# Patient Record
Sex: Male | Born: 1970 | Race: White | Hispanic: No | Marital: Single | Smoking: Never smoker
Health system: Southern US, Community
[De-identification: ages and names within clinical notes are randomized; demographics above are authoritative.]

## PROBLEM LIST (undated history)

## (undated) DIAGNOSIS — J45909 Unspecified asthma, uncomplicated: Secondary | ICD-10-CM

## (undated) DIAGNOSIS — I10 Essential (primary) hypertension: Secondary | ICD-10-CM

---

## 1999-09-11 ENCOUNTER — Emergency Department (HOSPITAL_COMMUNITY): Admission: EM | Admit: 1999-09-11 | Discharge: 1999-09-11 | Payer: Self-pay | Admitting: *Deleted

## 2000-01-20 ENCOUNTER — Encounter: Payer: Self-pay | Admitting: Emergency Medicine

## 2000-01-20 ENCOUNTER — Emergency Department (HOSPITAL_COMMUNITY): Admission: EM | Admit: 2000-01-20 | Discharge: 2000-01-20 | Payer: Self-pay | Admitting: Emergency Medicine

## 2004-08-09 ENCOUNTER — Emergency Department (HOSPITAL_COMMUNITY): Admission: EM | Admit: 2004-08-09 | Discharge: 2004-08-09 | Payer: Self-pay | Admitting: Emergency Medicine

## 2007-07-15 ENCOUNTER — Ambulatory Visit: Payer: Self-pay | Admitting: Internal Medicine

## 2007-07-15 LAB — CONVERTED CEMR LAB
ALT: 22 units/L (ref 0–53)
AST: 27 units/L (ref 0–37)
Albumin: 4.3 g/dL (ref 3.5–5.2)
Alkaline Phosphatase: 77 units/L (ref 39–117)
BUN: 6 mg/dL (ref 6–23)
Basophils Absolute: 0 10*3/uL (ref 0.0–0.1)
Basophils Relative: 0.3 % (ref 0.0–1.0)
Bilirubin, Direct: 0.1 mg/dL (ref 0.0–0.3)
CO2: 33 meq/L — ABNORMAL HIGH (ref 19–32)
Calcium: 10.3 mg/dL (ref 8.4–10.5)
Chloride: 101 meq/L (ref 96–112)
Cholesterol: 189 mg/dL (ref 0–200)
Creatinine, Ser: 1.1 mg/dL (ref 0.4–1.5)
Eosinophils Absolute: 0.5 10*3/uL (ref 0.0–0.6)
Eosinophils Relative: 5.6 % — ABNORMAL HIGH (ref 0.0–5.0)
GFR calc Af Amer: 99 mL/min
GFR calc non Af Amer: 81 mL/min
Glucose, Bld: 101 mg/dL — ABNORMAL HIGH (ref 70–99)
HCT: 48.3 % (ref 39.0–52.0)
Hemoglobin: 17.2 g/dL — ABNORMAL HIGH (ref 13.0–17.0)
Lymphocytes Relative: 35.5 % (ref 12.0–46.0)
MCHC: 35.6 g/dL (ref 30.0–36.0)
MCV: 93.8 fL (ref 78.0–100.0)
Monocytes Absolute: 0.8 10*3/uL — ABNORMAL HIGH (ref 0.2–0.7)
Monocytes Relative: 8.9 % (ref 3.0–11.0)
Neutro Abs: 4.3 10*3/uL (ref 1.4–7.7)
Neutrophils Relative %: 49.7 % (ref 43.0–77.0)
Platelets: 201 10*3/uL (ref 150–400)
Potassium: 4.3 meq/L (ref 3.5–5.1)
RBC: 5.15 M/uL (ref 4.22–5.81)
RDW: 12.3 % (ref 11.5–14.6)
Sodium: 142 meq/L (ref 135–145)
Total Bilirubin: 0.7 mg/dL (ref 0.3–1.2)
Total Protein: 6.7 g/dL (ref 6.0–8.3)
WBC: 8.7 10*3/uL (ref 4.5–10.5)

## 2008-09-11 ENCOUNTER — Ambulatory Visit: Payer: Self-pay | Admitting: Internal Medicine

## 2008-09-11 LAB — CONVERTED CEMR LAB
ALT: 35 units/L (ref 0–53)
AST: 30 units/L (ref 0–37)
Albumin: 4.2 g/dL (ref 3.5–5.2)
Alkaline Phosphatase: 68 units/L (ref 39–117)
BUN: 8 mg/dL (ref 6–23)
Basophils Absolute: 0 10*3/uL (ref 0.0–0.1)
Basophils Relative: 0.4 % (ref 0.0–3.0)
Bilirubin Urine: NEGATIVE
Bilirubin, Direct: 0.1 mg/dL (ref 0.0–0.3)
Blood in Urine, dipstick: NEGATIVE
CO2: 31 meq/L (ref 19–32)
Calcium: 9.5 mg/dL (ref 8.4–10.5)
Chloride: 103 meq/L (ref 96–112)
Cholesterol: 198 mg/dL (ref 0–200)
Creatinine, Ser: 0.9 mg/dL (ref 0.4–1.5)
Direct LDL: 98.4 mg/dL
Eosinophils Absolute: 0.4 10*3/uL (ref 0.0–0.7)
Eosinophils Relative: 4.6 % (ref 0.0–5.0)
GFR calc Af Amer: 123 mL/min
GFR calc non Af Amer: 102 mL/min
Glucose, Bld: 81 mg/dL (ref 70–99)
Glucose, Urine, Semiquant: NEGATIVE
HCT: 45.8 % (ref 39.0–52.0)
HDL: 37.9 mg/dL — ABNORMAL LOW (ref >39.0)
Hemoglobin: 16.5 g/dL (ref 13.0–17.0)
Ketones, urine, test strip: NEGATIVE
Lymphocytes Relative: 41.7 % (ref 12.0–46.0)
MCHC: 36 g/dL (ref 30.0–36.0)
MCV: 94.6 fL (ref 78.0–100.0)
Monocytes Absolute: 0.6 10*3/uL (ref 0.1–1.0)
Monocytes Relative: 7.3 % (ref 3.0–12.0)
Neutro Abs: 3.5 10*3/uL (ref 1.4–7.7)
Neutrophils Relative %: 46 % (ref 43.0–77.0)
Nitrite: NEGATIVE
Platelets: 180 10*3/uL (ref 150–400)
Potassium: 4.3 meq/L (ref 3.5–5.1)
Protein, U semiquant: NEGATIVE
RBC: 4.84 M/uL (ref 4.22–5.81)
RDW: 12.5 % (ref 11.5–14.6)
Sodium: 142 meq/L (ref 135–145)
Specific Gravity, Urine: 1.02
TSH: 1.55 microintl units/mL (ref 0.35–5.50)
Total Bilirubin: 1 mg/dL (ref 0.3–1.2)
Total CHOL/HDL Ratio: 5.2
Total Protein: 6.9 g/dL (ref 6.0–8.3)
Triglycerides: 279 mg/dL (ref 0–149)
Urobilinogen, UA: 0.2
VLDL: 56 mg/dL — ABNORMAL HIGH (ref 0–40)
WBC Urine, dipstick: NEGATIVE
WBC: 7.7 10*3/uL (ref 4.5–10.5)
pH: 6

## 2008-09-25 ENCOUNTER — Ambulatory Visit: Payer: Self-pay | Admitting: Internal Medicine

## 2009-09-28 ENCOUNTER — Ambulatory Visit: Payer: Self-pay | Admitting: Internal Medicine

## 2009-09-28 DIAGNOSIS — K219 Gastro-esophageal reflux disease without esophagitis: Secondary | ICD-10-CM | POA: Insufficient documentation

## 2010-04-02 ENCOUNTER — Ambulatory Visit: Payer: Self-pay | Admitting: Internal Medicine

## 2010-04-02 DIAGNOSIS — J069 Acute upper respiratory infection, unspecified: Secondary | ICD-10-CM | POA: Insufficient documentation

## 2010-07-12 ENCOUNTER — Ambulatory Visit
Admission: RE | Admit: 2010-07-12 | Discharge: 2010-07-12 | Payer: Self-pay | Source: Home / Self Care | Attending: Internal Medicine | Admitting: Internal Medicine

## 2010-07-12 DIAGNOSIS — L259 Unspecified contact dermatitis, unspecified cause: Secondary | ICD-10-CM | POA: Insufficient documentation

## 2010-08-06 NOTE — Letter (Signed)
Summary: Out of Work  Adult nurse at [employer]  [address]   [place], [place] [address]   Phone: [phone]  Fax: [phone]    April 02, 2010   Employee:  Pedro Davis    To Whom It May Concern:   For Medical reasons, please excuse the above named employee from work for the following dates:  Start:   04-01-10  End:   04-04-10  If you need additional information, please feel free to contact our office.         Sincerely,    Gordy Savers  MD

## 2010-08-06 NOTE — Assessment & Plan Note (Signed)
Summary: COUGH, CONGESTION // RS   Vital Signs:  Patient profile:   40 year old male Weight:      170 pounds Temp:     98.2 degrees F oral BP sitting:   148 / 82  (right arm) Cuff size:   regular  Vitals Entered By: Duard Brady LPN (April 02, 2010 2:27 PM) CC: c/o head and chest congestion, productive cough Is Patient Diabetic? No   CC:  c/o head and chest congestion and productive cough.  History of Present Illness: 40 year old patient, who presents with a 3 day history of head and chest congestion, and frequent paroxysms of coughing.  There is been no fever, shortness of breath, wheezing, or chest pain.  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  Preventive Screening-Counseling & Management  Alcohol-Tobacco     Smoking Status: current  Allergies (verified): No Known Drug Allergies  Past History:  Past Medical History: Reviewed history from 07/15/2007 and no changes required. unremarkable no prior  Review of Systems       The patient complains of prolonged cough.  The patient denies anorexia, fever, weight loss, weight gain, vision loss, decreased hearing, hoarseness, chest pain, syncope, dyspnea on exertion, peripheral edema, headaches, hemoptysis, abdominal pain, melena, hematochezia, severe indigestion/heartburn, hematuria, incontinence, genital sores, muscle weakness, suspicious skin lesions, transient blindness, difficulty walking, depression, unusual weight change, abnormal bleeding, enlarged lymph nodes, angioedema, breast masses, and testicular masses.    Physical Exam  General:  Well-developed,well-nourished,in no acute distress; alert,appropriate and cooperative throughout examination Head:  Normocephalic and atraumatic without obvious abnormalities. No apparent alopecia or balding. Eyes:  No corneal or conjunctival inflammation noted. EOMI. Perrla. Funduscopic exam benign, without hemorrhages, exudates or  papilledema. Vision grossly normal. Ears:  External ear exam shows no significant lesions or deformities.  Otoscopic examination reveals clear canals, tympanic membranes are intact bilaterally without bulging, retraction, inflammation or discharge. Hearing is grossly normal bilaterally. Nose:  External nasal examination shows no deformity or inflammation. Nasal mucosa are pink and moist without lesions or exudates. Mouth:  Oral mucosa and oropharynx without lesions or exudates.  Teeth in good repair. Neck:  No deformities, masses, or tenderness noted. Chest Wall:  No deformities, masses, tenderness or gynecomastia noted. Lungs:  Normal respiratory effort, chest expands symmetrically. Lungs are clear to auscultation, no crackles or wheezes. Heart:  Normal rate and regular rhythm. S1 and S2 normal without gallop, murmur, click, rub or other extra sounds.   Impression & Recommendations:  Problem # 1:  URI (ICD-465.9)  His updated medication list for this problem includes:    Hydrocodone-homatropine 5-1.5 Mg/70ml Syrp (Hydrocodone-homatropine) .Marland Kitchen... 1 teaspoon every 6 hours as needed for cough  His updated medication list for this problem includes:    Hydrocodone-homatropine 5-1.5 Mg/84ml Syrp (Hydrocodone-homatropine) .Marland Kitchen... 1 teaspoon every 6 hours as needed for cough  Complete Medication List: 1)  Hydrocodone-homatropine 5-1.5 Mg/43ml Syrp (Hydrocodone-homatropine) .Marland Kitchen.. 1 teaspoon every 6 hours as needed for cough  Patient Instructions: 1)  Get plenty of rest, drink lots of clear liquids, and use Tylenol or Ibuprofen for fever and comfort. Return in 7-10 days if you're not better:sooner if you're feeling worse. Prescriptions: HYDROCODONE-HOMATROPINE 5-1.5 MG/5ML SYRP (HYDROCODONE-HOMATROPINE) 1 teaspoon every 6 hours as needed for cough  #6 oz x 0   Entered and Authorized by:   Gordy Savers  MD   Signed by:   Gordy Savers  MD on 04/02/2010  Method used:   Print then Give to  Patient   RxID:   [id number]

## 2010-08-06 NOTE — Assessment & Plan Note (Signed)
Summary: CONSULT RE: ACID REFLUX ISSUES/CJR/pt rescd//ccm   Vital Signs:  Patient profile:   40 year old male Weight:      164 pounds Temp:     98.0 degrees F BP sitting:   130 / 80  (right arm) Cuff size:   regular  Vitals Entered By: Duard Brady LPN (September 28, 2009 10:24 AM) CC: c/o gerd sx past exercise , burning Is Patient Diabetic? No   CC:  c/o gerd sx past exercise  and burning.  History of Present Illness:  40 year old patient who experienced what he describes as indigestion while exercising in 7 days ago.  He describes some substernal burning pain with belching.  It initially began while he was on a treadmill.  For the next 3 days, it  was intermittently bothersome, but has not recurred for the past 3 days.  He has been using some decongestants.  Denies any prior history of reflux. he denies any nausea, vomiting, or change in his bowel habits.  He denies any melena or history of peptic ulcer disease  Preventive Screening-Counseling & Management  Alcohol-Tobacco     Smoking Status: current  Allergies (verified): No Known Drug Allergies  Past History:  Past Medical History: Reviewed history from 07/15/2007 and no changes required. unremarkable no prior  Review of Systems       The patient complains of severe indigestion/heartburn.  The patient denies anorexia, fever, weight loss, weight gain, vision loss, decreased hearing, hoarseness, chest pain, syncope, dyspnea on exertion, peripheral edema, prolonged cough, headaches, hemoptysis, abdominal pain, melena, hematochezia, hematuria, incontinence, genital sores, muscle weakness, suspicious skin lesions, transient blindness, difficulty walking, depression, unusual weight change, abnormal bleeding, enlarged lymph nodes, angioedema, breast masses, and testicular masses.    Physical Exam  General:  Well-developed,well-nourished,in no acute distress; alert,appropriate and cooperative throughout examination Head:   Normocephalic and atraumatic without obvious abnormalities. No apparent alopecia or balding. Eyes:  No corneal or conjunctival inflammation noted. EOMI. Perrla. Funduscopic exam benign, without hemorrhages, exudates or papilledema. Vision grossly normal. Mouth:  Oral mucosa and oropharynx without lesions or exudates.  Teeth in good repair. Neck:  No deformities, masses, or tenderness noted. Lungs:  Normal respiratory effort, chest expands symmetrically. Lungs are clear to auscultation, no crackles or wheezes. Heart:  Normal rate and regular rhythm. S1 and S2 normal without gallop, murmur, click, rub or other extra sounds.   Impression & Recommendations:  Problem # 1:  GERD (ICD-530.81)  Complete Medication List: 1)  Chantix Starting Month Pak 0.5 Mg X 11 & 1 Mg X 42 Misc (Varenicline tartrate) .... As directed 2)  Chantix 1 Mg Tabs (Varenicline tartrate) .... One twice daily  Patient Instructions: 1)  Please schedule a follow-up appointment as needed. 2)  Avoid foods high in acid (tomatoes, citrus juices, spicy foods). Avoid eating within two hours of lying down or before exercising. Do not over eat; try smaller more frequent meals. Elevate head of bed twelve inches when sleeping.

## 2010-08-08 NOTE — Assessment & Plan Note (Signed)
Summary: rash ok per kim/njr   Vital Signs:  Patient profile:   40 year old male Weight:      178 pounds Temp:     98.2 degrees F oral BP sitting:   160 / 90  (right arm) Cuff size:   regular  Vitals Entered By: Duard Brady LPN (July 12, 2010 11:42 AM) CC: c/o rash x 3wks - (L) flank, neck, arm and lower leg Is Patient Diabetic? No   CC:  c/o rash x 3wks - (L) flank, neck, and arm and lower leg.  History of Present Illness: 40 -year-old patient who is seen today for follow-up in the past 3 weeks.  She has had a slightly pruritic rash involving primarily his left flank area, but also his extremities as well.  This has been nonprogressive.  He is on no chronic medications  Allergies (verified): No Known Drug Allergies  Past History:  Past Medical History: Reviewed history from 07/15/2007 and no changes required. unremarkable no prior  Review of Systems       The patient complains of suspicious skin lesions.    Physical Exam  General:  Well-developed,well-nourished,in no acute distress; alert,appropriate and cooperative throughout examination Skin:  patchy areas of erythema with scaling.  No herald patch; a few scattered areas of mild excoriation and folliculitis   Impression & Recommendations:  Problem # 1:  DERMATITIS (ICD-692.9) this is very nonspecific.  Will try and shoulders shampoo as a body wash for one week.  He is concerned about possible lice and will empirically treat with Nix if unimproved after that.  Complete Medication List: 1)  Hydrocodone-homatropine 5-1.5 Mg/51ml Syrp (Hydrocodone-homatropine) .Marland Kitchen.. 1 teaspoon every 6 hours as needed for cough  Patient Instructions: 1)  use head and shoulders shampoo as a body wash for the next week 2)  Please schedule a follow-up appointment as needed.   Orders Added: 1)  Est. Patient Level III [16109]

## 2011-07-22 ENCOUNTER — Ambulatory Visit: Payer: Self-pay

## 2011-07-22 DIAGNOSIS — R42 Dizziness and giddiness: Secondary | ICD-10-CM

## 2017-01-20 ENCOUNTER — Encounter: Payer: Self-pay | Admitting: Internal Medicine

## 2017-01-20 ENCOUNTER — Ambulatory Visit (INDEPENDENT_AMBULATORY_CARE_PROVIDER_SITE_OTHER): Payer: Worker's Compensation | Admitting: Internal Medicine

## 2017-01-20 VITALS — BP 148/90 | HR 103 | Temp 98.1°F | Resp 12 | Ht 70.0 in | Wt 187.0 lb

## 2017-01-20 DIAGNOSIS — M549 Dorsalgia, unspecified: Secondary | ICD-10-CM | POA: Diagnosis not present

## 2017-01-20 MED ORDER — METHYLPREDNISOLONE ACETATE 80 MG/ML IJ SUSP
80.0000 mg | Freq: Once | INTRAMUSCULAR | Status: AC
  Administered 2017-01-20: 80 mg via INTRAMUSCULAR

## 2017-01-20 NOTE — Progress Notes (Signed)
   Subjective:    Patient ID: Pedro LoraBrian Lata, male    DOB: 1971/03/04, 46 y.o.   MRN: [id number]  HPI  46 year old patient who presents with a one-day history of acute lumbar pain.  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  He states that he had a "pinched nerve"in the nineties, but no history of chronic low back pain.  He feels the pain may radiate slightly to the left hip area.  He took Aleve and did better yesterday, but was quite stiff when he awoke today.  No radicular symptoms.  No past medical history on file.   Social History   Social History  . Marital status: Single    Spouse name: N/A  . Number of children: N/A  . Years of education: N/A   Occupational History  . Not on file.   Social History Main Topics  . Smoking status: Never Smoker  . Smokeless tobacco: Never Used  . Alcohol use Not on file  . Drug use: Unknown  . Sexual activity: Not on file   Other Topics Concern  . Not on file   Social History Narrative  . No narrative on file    No past surgical history on file.  No family history on file.  Not on File  No current outpatient prescriptions on file prior to visit.   No current facility-administered medications on file prior to visit.     BP (!) 148/90 (BP Location: Left Arm, Patient Position: Sitting, Cuff Size: Normal)   Pulse (!) 103   Temp 98.1 F (36.7 C) (Oral)   Resp 12   Ht 5\' 10"  (1.778 m)   Wt 187 lb (84.8 kg)   SpO2 98%   BMI 26.83 kg/m     Review of Systems  Constitutional: Negative.   Musculoskeletal: Positive for back pain.       Objective:   Physical Exam  Constitutional: He appears well-developed and well-nourished. No distress.  Blood pressure 150/80 Uncomfortable with assuming supine position from sitting position  Musculoskeletal:  Right lumbar musculature tight and tense Negative straight leg test Range of motion right hip did cause some discomfort in the right lumbar area          Assessment & Plan:   Lumbar strain.  Patient will rest and apply ice for 24 hours Continue Aleve when necessary Treated.  Depo-Medrol 80 mg IM  Note for work.  Dictated  Rogelia BogaKWIATKOWSKI,PETER FRANK

## 2017-01-20 NOTE — Patient Instructions (Signed)
You  may move around, but avoid painful motions and activities.  Apply ice to the sore area for 15 to 20 minutes 3 or 4 times daily for the next two to 3 days.   Take Aleve 200 mg twice daily for pain   Call or return to clinic prn if these symptoms worsen or fail to improve as anticipated.  

## 2017-12-28 ENCOUNTER — Ambulatory Visit (INDEPENDENT_AMBULATORY_CARE_PROVIDER_SITE_OTHER): Payer: Self-pay | Admitting: Internal Medicine

## 2017-12-28 ENCOUNTER — Encounter: Payer: Self-pay | Admitting: Internal Medicine

## 2017-12-28 VITALS — BP 146/90 | HR 90 | Temp 98.7°F | Wt 177.0 lb

## 2017-12-28 DIAGNOSIS — G4733 Obstructive sleep apnea (adult) (pediatric): Secondary | ICD-10-CM

## 2017-12-28 NOTE — Progress Notes (Signed)
Subjective:    Patient ID: Pedro Davis, male    DOB: 02-07-1971, 47 y.o.   MRN: [id number]  HPI  BP Readings from Last 3 Encounters:  12/28/17 (!) 146/90  01/20/17 (!) 148/90  07/12/10 (!) 36/81   47 year old patient who is seen today with blood pressure concerns.  He states that he uses a Fit-bit with some high readings.  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  His chief complaint is a sensation of heaviness and tightness in the chest when he is in a supine position or laying on his right side he states this has been an issue for approximately 2 years.  When he nears sleep he describes a sensation of a weight on his chest when he is not prone or on his left side.  He does describe loud snoring and some issues with a daytime sleepiness.  He does drive for a living and states that he often requires caffeinated beverages when he drives.  He states he often turns up the volume on his radio to assist with sleepiness. His girlfriend states that he is quite restless throughout sleep but no history of apnea  History reviewed. No pertinent past medical history.   Social History   Socioeconomic History  . Marital status: Single    Spouse name: Not on file  . Number of children: Not on file  . Years of education: Not on file  . Highest education level: Not on file  Occupational History  . Not on file  Social Needs  . Financial resource strain: Not on file  . Food insecurity:    Worry: Not on file    Inability: Not on file  . Transportation needs:    Medical: Not on file    Non-medical: Not on file  Tobacco Use  . Smoking status: Never Smoker  . Smokeless tobacco: Never Used  Substance and Sexual Activity  . Alcohol use: Not on file  . Drug use: Not on file  . Sexual activity: Not on file  Lifestyle  . Physical activity:    Days per week: Not on file    Minutes per session: Not on file  . Stress: Not on file    Relationships  . Social connections:    Talks on phone: Not on file    Gets together: Not on file    Attends religious service: Not on file    Active member of club or organization: Not on file    Attends meetings of clubs or organizations: Not on file    Relationship status: Not on file  . Intimate partner violence:    Fear of current or ex partner: Not on file    Emotionally abused: Not on file    Physically abused: Not on file    Forced sexual activity: Not on file  Other Topics Concern  . Not on file  Social History Narrative  . Not on file    History reviewed. No pertinent surgical history.  History reviewed. No pertinent family history.  Not on File  No current outpatient medications on file prior to visit.   No current facility-administered medications on file prior to visit.     BP (!) 146/90 (BP Location: Right Arm, Patient Position: Sitting, Cuff Size: Large)   Pulse 90   Temp 98.7 F (37.1 C) (Oral)   Wt 177 lb (80.3 kg)   SpO2 97%   BMI 25.40 kg/m  Review of Systems  Constitutional: Negative for appetite change, chills, fatigue and fever.  HENT: Negative for congestion, dental problem, ear pain, hearing loss, sore throat, tinnitus, trouble swallowing and voice change.   Eyes: Negative for pain, discharge and visual disturbance.  Respiratory: Negative for cough, chest tightness, wheezing and stridor.   Cardiovascular: Negative for chest pain, palpitations and leg swelling.  Gastrointestinal: Negative for abdominal distention, abdominal pain, blood in stool, constipation, diarrhea, nausea and vomiting.  Genitourinary: Negative for difficulty urinating, discharge, flank pain, genital sores, hematuria and urgency.  Musculoskeletal: Negative for arthralgias, back pain, gait problem, joint swelling, myalgias and neck stiffness.  Skin: Negative for rash.  Neurological: Negative for dizziness, syncope, speech difficulty, weakness, numbness and headaches.   Hematological: Negative for adenopathy. Does not bruise/bleed easily.  Psychiatric/Behavioral: Positive for sleep disturbance. Negative for behavioral problems and dysphoric mood. The patient is not nervous/anxious.        Objective:   Physical Exam  Constitutional: He is oriented to person, place, and time. He appears well-developed.  Blood pressure 144/90  HENT:  Head: Normocephalic.  Right Ear: External ear normal.  Left Ear: External ear normal.  Pharyngeal crowding  Eyes: Conjunctivae and EOM are normal.  Neck: Normal range of motion.  Cardiovascular: Normal rate and normal heart sounds.  Pulmonary/Chest: Breath sounds normal.  Abdominal: Bowel sounds are normal.  Musculoskeletal: Normal range of motion. He exhibits no edema or tenderness.  Neurological: He is alert and oriented to person, place, and time.  Psychiatric: He has a normal mood and affect. His behavior is normal.          Assessment & Plan:  Rule out essential hypertension . blood pressure elevated today.  Patient has been asked to obtain a home blood pressure monitor and to bring to the office on his next visit.  Patient placed on a DASH diet OSA suspect- patient will be set up for a sleep study evaluation.  If OSA is confirmed, may be a factor for elevated blood pressure  Reassess 4 weeks DASH diet Home blood pressure monitoring Consider further treatment at next office visit  Gordy SaversPeter F Kwiatkowski

## 2017-12-28 NOTE — Patient Instructions (Addendum)
Limit your sodium (Salt) intake  Please check your blood pressure on a regular basis.  If it is consistently greater than 150/90, please make an office appointment.   Please obtain a full size upper arm blood pressure cuff/monitor to check blood pressure readings return in 1 month for follow-up   Return in 1 month for follow-up   Sleep study evaluation to rule out OSA     DASH Eating Plan DASH stands for "Dietary Approaches to Stop Hypertension." The DASH eating plan is a healthy eating plan that has been shown to reduce high blood pressure (hypertension). It may also reduce your risk for type 2 diabetes, heart disease, and stroke. The DASH eating plan may also help with weight loss. What are tips for following this plan? General guidelines  Avoid eating more than 2,300 mg (milligrams) of salt (sodium) a day. If you have hypertension, you may need to reduce your sodium intake to 1,500 mg a day.  Limit alcohol intake to no more than 1 drink a day for nonpregnant women and 2 drinks a day for men. One drink equals 12 oz of beer, 5 oz of wine, or 1 oz of hard liquor.  Work with your health care provider to maintain a healthy body weight or to lose weight. Ask what an ideal weight is for you.  Get at least 30 minutes of exercise that causes your heart to beat faster (aerobic exercise) most days of the week. Activities may include walking, swimming, or biking.  Work with your health care provider or diet and nutrition specialist (dietitian) to adjust your eating plan to your individual calorie needs. Reading food labels  Check food labels for the amount of sodium per serving. Choose foods with less than 5 percent of the Daily Value of sodium. Generally, foods with less than 300 mg of sodium per serving fit into this eating plan.  To find whole grains, look for the word "whole" as the first word in the ingredient list. Shopping  Buy products labeled as "low-sodium" or "no salt  added."  Buy fresh foods. Avoid canned foods and premade or frozen meals. Cooking  Avoid adding salt when cooking. Use salt-free seasonings or herbs instead of table salt or sea salt. Check with your health care provider or pharmacist before using salt substitutes.  Do not fry foods. Cook foods using healthy methods such as baking, boiling, grilling, and broiling instead.  Cook with heart-healthy oils, such as olive, canola, soybean, or sunflower oil. Meal planning   Eat a balanced diet that includes: ? 5 or more servings of fruits and vegetables each day. At each meal, try to fill half of your plate with fruits and vegetables. ? Up to 6-8 servings of whole grains each day. ? Less than 6 oz of lean meat, poultry, or fish each day. A 3-oz serving of meat is about the same size as a deck of cards. One egg equals 1 oz. ? 2 servings of low-fat dairy each day. ? A serving of nuts, seeds, or beans 5 times each week. ? Heart-healthy fats. Healthy fats called Omega-3 fatty acids are found in foods such as flaxseeds and coldwater fish, like sardines, salmon, and mackerel.  Limit how much you eat of the following: ? Canned or prepackaged foods. ? Food that is high in trans fat, such as fried foods. ? Food that is high in saturated fat, such as fatty meat. ? Sweets, desserts, sugary drinks, and other foods with added sugar. ?  Full-fat dairy products.  Do not salt foods before eating.  Try to eat at least 2 vegetarian meals each week.  Eat more home-cooked food and less restaurant, buffet, and fast food.  When eating at a restaurant, ask that your food be prepared with less salt or no salt, if possible. What foods are recommended? The items listed may not be a complete list. Talk with your dietitian about what dietary choices are best for you. Grains Whole-grain or whole-wheat bread. Whole-grain or whole-wheat pasta. Brown rice. [place]. Bulgur. Whole-grain and low-sodium cereals.  Pita bread. Low-fat, low-sodium crackers. Whole-wheat flour tortillas. Vegetables Fresh or frozen vegetables (raw, steamed, roasted, or grilled). Low-sodium or reduced-sodium tomato and vegetable juice. Low-sodium or reduced-sodium tomato sauce and tomato paste. Low-sodium or reduced-sodium canned vegetables. Fruits All fresh, dried, or frozen fruit. Canned fruit in natural juice (without added sugar). Meat and other protein foods Skinless chicken or [place]. Ground chicken or [place]. Pork with fat trimmed off. Fish and seafood. Egg whites. Dried beans, peas, or lentils. Unsalted nuts, nut butters, and seeds. Unsalted canned beans. Lean cuts of beef with fat trimmed off. Low-sodium, lean deli meat. Dairy Low-fat (1%) or fat-free (skim) milk. Fat-free, low-fat, or reduced-fat cheeses. Nonfat, low-sodium ricotta or cottage cheese. Low-fat or nonfat yogurt. Low-fat, low-sodium cheese. Fats and oils Soft margarine without trans fats. Vegetable oil. Low-fat, reduced-fat, or light mayonnaise and salad dressings (reduced-sodium). Canola, safflower, olive, soybean, and sunflower oils. Avocado. Seasoning and other foods Herbs. Spices. Seasoning mixes without salt. Unsalted popcorn and pretzels. Fat-free sweets. What foods are not recommended? The items listed may not be a complete list. Talk with your dietitian about what dietary choices are best for you. Grains Baked goods made with fat, such as croissants, muffins, or some breads. Dry pasta or rice meal packs. Vegetables Creamed or fried vegetables. Vegetables in a cheese sauce. Regular canned vegetables (not low-sodium or reduced-sodium). Regular canned tomato sauce and paste (not low-sodium or reduced-sodium). Regular tomato and vegetable juice (not low-sodium or reduced-sodium). Angie Fava. Olives. Fruits Canned fruit in a light or heavy syrup. Fried fruit. Fruit in cream or butter sauce. Meat and other protein foods Fatty cuts of meat. Ribs. Fried  meat. Berniece Salines. Sausage. Bologna and other processed lunch meats. Salami. Fatback. Hotdogs. Bratwurst. Salted nuts and seeds. Canned beans with added salt. Canned or smoked fish. Whole eggs or egg yolks. Chicken or [place] with skin. Dairy Whole or 2% milk, cream, and half-and-half. Whole or full-fat cream cheese. Whole-fat or sweetened yogurt. Full-fat cheese. Nondairy creamers. Whipped toppings. Processed cheese and cheese spreads. Fats and oils Butter. Stick margarine. Lard. Shortening. Ghee. Bacon fat. Tropical oils, such as coconut, palm kernel, or palm oil. Seasoning and other foods Salted popcorn and pretzels. Onion salt, garlic salt, seasoned salt, table salt, and sea salt. Worcestershire sauce. Tartar sauce. Barbecue sauce. Teriyaki sauce. Soy sauce, including reduced-sodium. Steak sauce. Canned and packaged gravies. Fish sauce. Oyster sauce. Cocktail sauce. Horseradish that you find on the shelf. Ketchup. Mustard. Meat flavorings and tenderizers. Bouillon cubes. Hot sauce and Tabasco sauce. Premade or packaged marinades. Premade or packaged taco seasonings. Relishes. Regular salad dressings. Where to find more information:  National Heart, Lung, and [place]: https://wilson-eaton.com/  American Heart Association: www.heart.org Summary  The DASH eating plan is a healthy eating plan that has been shown to reduce high blood pressure (hypertension). It may also reduce your risk for type 2 diabetes, heart disease, and stroke.  With the DASH eating plan, you  should limit salt (sodium) intake to 2,300 mg a day. If you have hypertension, you may need to reduce your sodium intake to 1,500 mg a day.  When on the DASH eating plan, aim to eat more fresh fruits and vegetables, whole grains, lean proteins, low-fat dairy, and heart-healthy fats.  Work with your health care provider or diet and nutrition specialist (dietitian) to adjust your eating plan to your individual calorie needs. This information is  not intended to replace advice given to you by your health care provider. Make sure you discuss any questions you have with your health care provider. Document Released: 06/12/2011 Document Revised: 06/16/2016 Document Reviewed: 06/16/2016 Elsevier Interactive Patient Education  [employer].

## 2018-01-27 ENCOUNTER — Ambulatory Visit: Payer: Self-pay | Admitting: Internal Medicine

## 2019-04-01 ENCOUNTER — Ambulatory Visit
Admission: RE | Admit: 2019-04-01 | Discharge: 2019-04-01 | Disposition: A | Discharge status: Home / Self Care | Payer: 59 | Source: Ambulatory Visit | Attending: *Deleted | Admitting: *Deleted

## 2019-04-01 ENCOUNTER — Other Ambulatory Visit: Payer: Self-pay | Admitting: *Deleted

## 2019-04-01 DIAGNOSIS — J180 Bronchopneumonia, unspecified organism: Secondary | ICD-10-CM

## 2020-06-16 IMAGING — DX DG CHEST 2V
2 series · 2 of 2 positions shown · non-contrast
Comparison: None.

CLINICAL DATA: Coughing congestion for 1 week.

EXAM:
CHEST - 2 VIEW

[dg chest 2 view (1 of 2)]
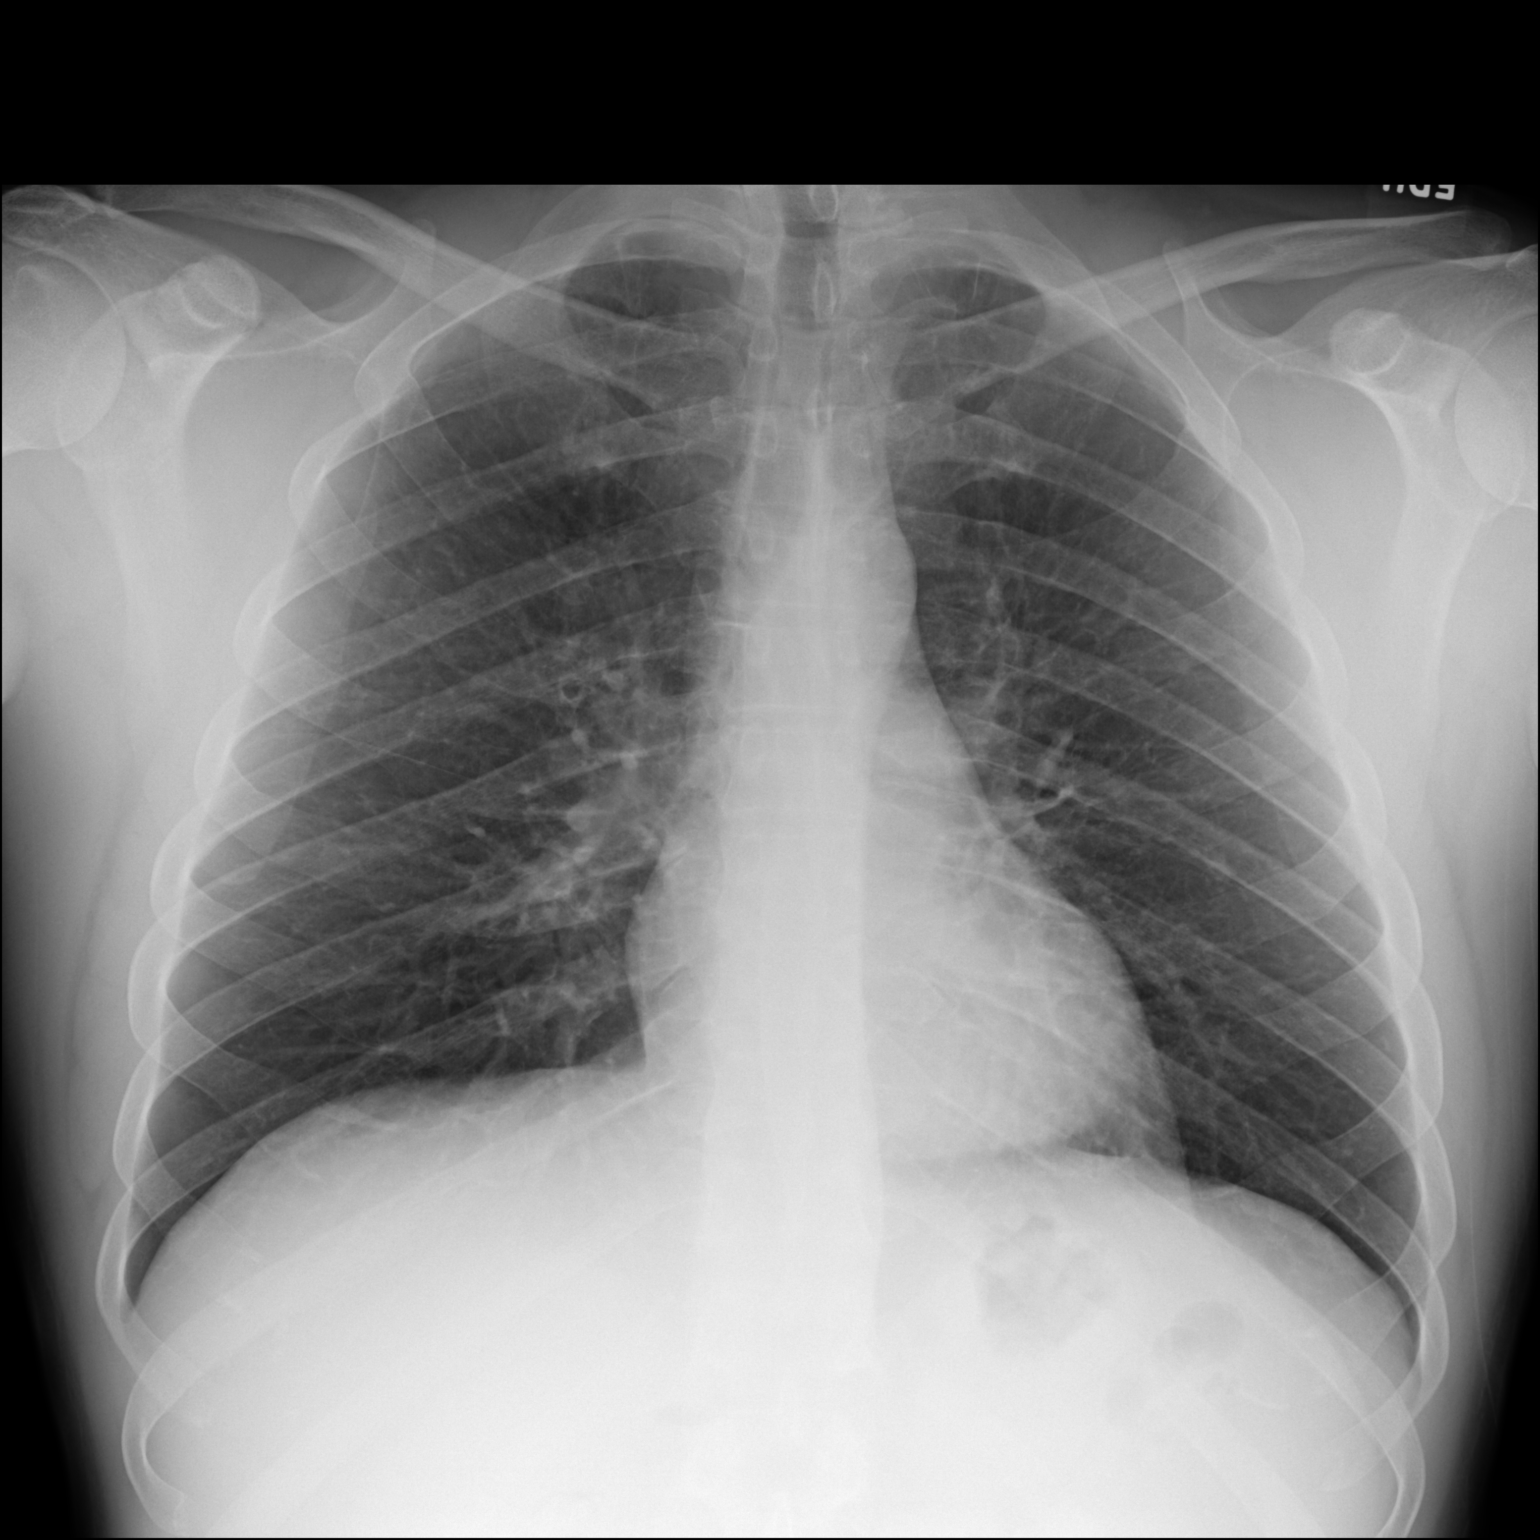

[dg chest 2 view (2 of 2)]
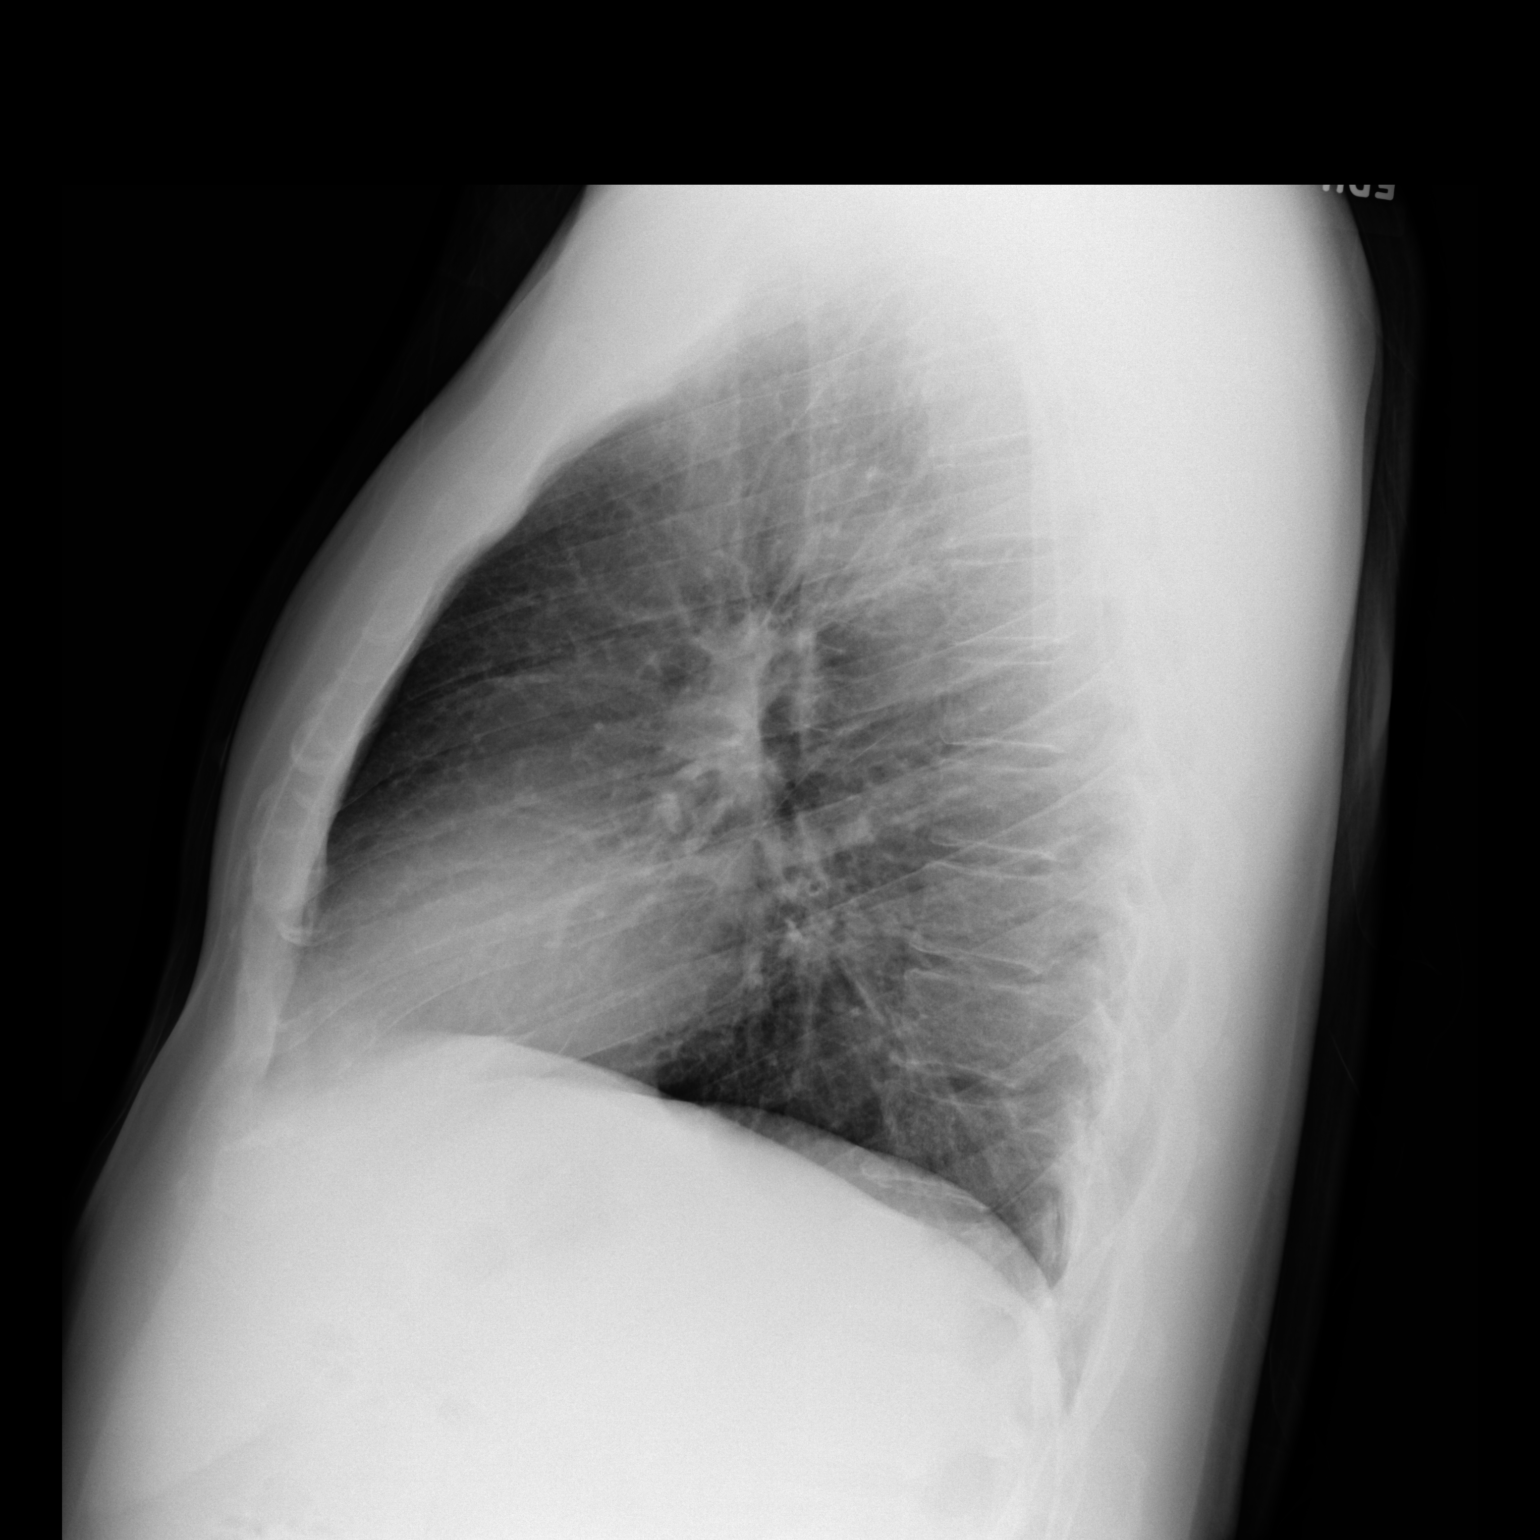

[2 of 2 positions shown; findings below may reference images not displayed]

FINDINGS: Lungs are mildly hyperexpanded. The lungs are clear without focal
pneumonia, edema, pneumothorax or pleural effusion. The
cardiopericardial silhouette is within normal limits for size. The
visualized bony structures of the thorax are intact.
IMPRESSION: Mild hyperexpansion.  No acute cardiopulmonary findings.

## 2023-04-29 ENCOUNTER — Emergency Department (HOSPITAL_COMMUNITY): Payer: 59

## 2023-04-29 ENCOUNTER — Encounter (HOSPITAL_COMMUNITY): Payer: Self-pay

## 2023-04-29 ENCOUNTER — Other Ambulatory Visit: Payer: Self-pay

## 2023-04-29 ENCOUNTER — Emergency Department (HOSPITAL_COMMUNITY)
Admission: EM | Admit: 2023-04-29 | Discharge: 2023-04-29 | Disposition: A | Discharge status: Home / Self Care | Payer: 59 | Attending: Emergency Medicine | Admitting: Emergency Medicine

## 2023-04-29 DIAGNOSIS — J45909 Unspecified asthma, uncomplicated: Secondary | ICD-10-CM | POA: Insufficient documentation

## 2023-04-29 DIAGNOSIS — K645 Perianal venous thrombosis: Secondary | ICD-10-CM | POA: Insufficient documentation

## 2023-04-29 DIAGNOSIS — K625 Hemorrhage of anus and rectum: Secondary | ICD-10-CM | POA: Diagnosis present

## 2023-04-29 DIAGNOSIS — I10 Essential (primary) hypertension: Secondary | ICD-10-CM | POA: Insufficient documentation

## 2023-04-29 DIAGNOSIS — R1032 Left lower quadrant pain: Secondary | ICD-10-CM | POA: Diagnosis not present

## 2023-04-29 HISTORY — DX: Unspecified asthma, uncomplicated: J45.909

## 2023-04-29 HISTORY — DX: Essential (primary) hypertension: I10

## 2023-04-29 LAB — CBC WITH DIFFERENTIAL/PLATELET
Abs Immature Granulocytes: 0.04 10*3/uL (ref 0.00–0.07)
Basophils Absolute: 0.1 10*3/uL (ref 0.0–0.1)
Basophils Relative: 1 %
Eosinophils Absolute: 0.4 10*3/uL (ref 0.0–0.5)
Eosinophils Relative: 5 %
HCT: 44.4 % (ref 39.0–52.0)
Hemoglobin: 15.6 g/dL (ref 13.0–17.0)
Immature Granulocytes: 1 %
Lymphocytes Relative: 35 %
Lymphs Abs: 2.5 10*3/uL (ref 0.7–4.0)
MCH: 34.1 pg — ABNORMAL HIGH (ref 26.0–34.0)
MCHC: 35.1 g/dL (ref 30.0–36.0)
MCV: 97.2 fL (ref 80.0–100.0)
Monocytes Absolute: 0.6 10*3/uL (ref 0.1–1.0)
Monocytes Relative: 9 %
Neutro Abs: 3.4 10*3/uL (ref 1.7–7.7)
Neutrophils Relative %: 49 %
Platelets: 183 10*3/uL (ref 150–400)
RBC: 4.57 MIL/uL (ref 4.22–5.81)
RDW: 12.7 % (ref 11.5–15.5)
WBC: 6.9 10*3/uL (ref 4.0–10.5)
nRBC: 0 % (ref 0.0–0.2)

## 2023-04-29 LAB — COMPREHENSIVE METABOLIC PANEL
ALT: 68 U/L — ABNORMAL HIGH (ref 0–44)
AST: 41 U/L (ref 15–41)
Albumin: 4.3 g/dL (ref 3.5–5.0)
Alkaline Phosphatase: 50 U/L (ref 38–126)
Anion gap: 9 (ref 5–15)
BUN: 7 mg/dL (ref 6–20)
CO2: 25 mmol/L (ref 22–32)
Calcium: 9.4 mg/dL (ref 8.9–10.3)
Chloride: 102 mmol/L (ref 98–111)
Creatinine, Ser: 0.92 mg/dL (ref 0.61–1.24)
GFR, Estimated: >60 mL/min (ref >60)
Glucose, Bld: 93 mg/dL (ref 70–99)
Potassium: 4.2 mmol/L (ref 3.5–5.1)
Sodium: 136 mmol/L (ref 135–145)
Total Bilirubin: 0.6 mg/dL (ref 0.3–1.2)
Total Protein: 7 g/dL (ref 6.5–8.1)

## 2023-04-29 LAB — TYPE AND SCREEN
ABO/RH(D): O POS
Antibody Screen: NEGATIVE

## 2023-04-29 LAB — LIPASE, BLOOD: Lipase: 35 U/L (ref 11–51)

## 2023-04-29 LAB — POC OCCULT BLOOD, ED: Fecal Occult Bld: POSITIVE — AB

## 2023-04-29 MED ORDER — IOHEXOL 300 MG/ML  SOLN
100.0000 mL | Freq: Once | INTRAMUSCULAR | Status: AC | PRN
  Administered 2023-04-29: 100 mL via INTRAVENOUS

## 2023-04-29 NOTE — ED Provider Notes (Signed)
[place] EMERGENCY DEPARTMENT AT [facility] Provider Note   CSN: [id number] Arrival date & time: 04/29/23  4010     History  Chief Complaint  Patient presents with   Rectal Bleeding    Pedro Davis is a 52 y.o. male.  With history of hypertension, asthma presenting to the ED for evaluation of rectal bleeding.  He states he had 2 bowel movements this morning, the second bowel movement had a significant amount of frank bright red blood both in the toilet and on the toilet paper.  He denies any history of similar.  He has never had a colonoscopy.  He reports mild left lower quadrant abdominal pain that began just prior to his second bowel movement.  Denies any history of bleeding disorder.  No fevers or chills, chest pain or shortness of breath.  No nausea or vomiting.  No urinary symptoms.  States his first bowel movement was normal in color.  Second bowel movement he was unable to appreciate due to the color of the water in the toilet bowl.  No known history of diverticulosis or hemorrhoids.  No rectal pain or pruritus.   Rectal Bleeding      Home Medications Prior to Admission medications   Not on File      Allergies    Patient has no known allergies.    Review of Systems   Review of Systems  Gastrointestinal:  Positive for blood in stool and hematochezia.  All other systems reviewed and are negative.   Physical Exam Updated Vital Signs BP 129/83 (BP Location: Left Arm)   Pulse 90   Temp 98 F (36.7 C) (Oral)   Resp 17   Ht 5\' 11"  (1.803 m)   Wt 87.1 kg   SpO2 97%   BMI 26.78 kg/m  Physical Exam Vitals and nursing note reviewed. Exam conducted with a chaperone present Gavin Pound, paramedic).  Constitutional:      General: He is not in acute distress.    Appearance: Normal appearance. He is normal weight. He is not ill-appearing.  HENT:     Head: Normocephalic and atraumatic.  Pulmonary:     Effort: Pulmonary effort is normal. No respiratory  distress.  Abdominal:     General: Abdomen is flat.     Tenderness: There is abdominal tenderness (LLQ).  Genitourinary:      Comments: Large, fleshy colored non thrombosed external hemorrhoid at the 6 o'clock position. No active bleeding. Observed stool is brown. No rectal masses appreciated. No fissures. Musculoskeletal:        General: Normal range of motion.     Cervical back: Neck supple.  Skin:    General: Skin is warm and dry.  Neurological:     Mental Status: He is alert and oriented to person, place, and time.  Psychiatric:        Mood and Affect: Mood normal.        Behavior: Behavior normal.     ED Results / Procedures / Treatments   Labs (all labs ordered are listed, but only abnormal results are displayed) Labs Reviewed  CBC WITH DIFFERENTIAL/PLATELET - Abnormal; Notable for the following components:      Result Value   MCH 34.1 (*)    All other components within normal limits  COMPREHENSIVE METABOLIC PANEL - Abnormal; Notable for the following components:   ALT 68 (*)    All other components within normal limits  POC OCCULT BLOOD, ED - Abnormal; Notable for the following  components:   Fecal Occult Bld POSITIVE (*)    All other components within normal limits  LIPASE, BLOOD  TYPE AND SCREEN  ABO/RH    EKG None  Radiology CT ABDOMEN PELVIS W CONTRAST  Result Date: 04/29/2023 CLINICAL DATA:  Hematochezia with left lower quadrant abdominal pain EXAM: CT ABDOMEN AND PELVIS WITH CONTRAST TECHNIQUE: Multidetector CT imaging of the abdomen and pelvis was performed using the standard protocol following bolus administration of intravenous contrast. RADIATION DOSE REDUCTION: This exam was performed according to the departmental dose-optimization program which includes automated exposure control, adjustment of the mA and/or kV according to patient size and/or use of iterative reconstruction technique. CONTRAST:  OMNIPAQUE IOHEXOL 300 MG/ML  SOLN COMPARISON:   None Available. FINDINGS: Lower chest: No focal consolidation or pulmonary nodule in the lung bases. No pleural effusion or pneumothorax demonstrated. Partially imaged heart size is normal. Hepatobiliary: Diffuse parenchymal hypoattenuation can be seen with hepatic steatosis. No intra or extrahepatic biliary ductal dilation. Normal gallbladder. Pancreas: No focal lesions or main ductal dilation. Spleen: Normal in size without focal abnormality. Adrenals/Urinary Tract: No adrenal nodules. Duplex right kidney. No suspicious renal mass, calculi or hydronephrosis. No focal bladder wall thickening. Stomach/Bowel: Normal appearance of the stomach. No evidence of bowel wall thickening, distention, or inflammatory changes. Appendix is not discretely seen. Vascular/Lymphatic: Aortic atherosclerosis. No enlarged abdominal or pelvic lymph nodes. Reproductive: Prostate is unremarkable. Other: No free fluid, fluid collection, or free air. Musculoskeletal: No acute or abnormal lytic or blastic osseous lesions. Small fat-containing left inguinal hernia. IMPRESSION: 1. No acute abdominopelvic findings. 2. Hepatic steatosis. 3. Small fat-containing left inguinal hernia. 4.  Aortic Atherosclerosis (ICD10-I70.0). Electronically Signed   By: Agustin Cree M.D.   On: 04/29/2023 10:38    Procedures Procedures    Medications Ordered in ED Medications  iohexol (OMNIPAQUE) 300 MG/ML solution 100 mL (100 mLs Intravenous Contrast Given 04/29/23 0843)    ED Course/ Medical Decision Making/ A&P                                 Medical Decision Making Amount and/or Complexity of Data Reviewed Labs: ordered. Radiology: ordered.  Risk Prescription drug management.   This patient presents to the ED for concern of rectal bleeding, this involves an extensive number of treatment options, and is a complaint that carries with it a high risk of complications and morbidity. The differential diagnosis for lower GI bleed includes but is  not limited to high flow upper GI bleed, diverticulosis, vascular ectasia/AVM, inflammatory bowel disease, infectious colitis, mesenteric ischemia or ischemic colitis,  colorectal cancer or polyps, internal hemorrhoids, aortoenteric fistula, rectal foreign body, rectal ulceration or anal fissure.   My initial workup includes labs, imaging  Additional history obtained from: Nursing notes from this visit.  I ordered, reviewed and interpreted labs which include: CBC, CMP, Lipase, Hemoccult.  Hemoccult positive.  Labs otherwise negative  I ordered imaging studies including CT abdomen pelvis I independently visualized and interpreted imaging which showed negative I agree with the radiologist interpretation  Afebrile, hemodynamically stable.  52 year old male presenting to the ED for evaluation of BRBPR after bowel movement.  He noted bright red blood on the toilet paper and in the toilet.  This was after his second bowel movement of the morning.  He did have some mild left lower quadrant abdominal pain and tenderness to palpation on my exam so a CT of the  abdomen and pelvis was obtained.  This was negative.  Lab workup reassuring.  No anemia or leukocytosis.  Hemoccult was positive, however did see brown stool on DRE.  Physical exam revealed a single large external nonthrombosed hemorrhoid.  No active bleeding on exam.  He has never had a colonoscopy.  Overall suspect hemorrhoidal bleeding.  He has not had any more bleeding here in the emergency department.  He was given contact information for GI and encouraged to follow-up.  He was given return precautions.  Stable at discharge.  At this time there does not appear to be any evidence of an acute emergency medical condition and the patient appears stable for discharge with appropriate outpatient follow up. Diagnosis was discussed with patient who verbalizes understanding of care plan and is agreeable to discharge. I have discussed return precautions with  patient who verbalizes understanding. Patient encouraged to follow-up with their PCP within 1 week. All questions answered.  Note: Portions of this report may have been transcribed using voice recognition software. Every effort was made to ensure accuracy; however, inadvertent computerized transcription errors may still be present.        Final Clinical Impression(s) / ED Diagnoses Final diagnoses:  Rectal bleeding    Rx / DC Orders ED Discharge Orders     None         Michelle Piper, PA-C 04/29/23 1056    Melene Plan, DO 04/29/23 1101

## 2023-04-29 NOTE — Discharge Instructions (Addendum)
You have been seen today for your complaint of rectal bleeding. Your lab work was reassuring. Your imaging was reassuring. Follow up with: Dr. Bosie Clos.  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  Call to schedule an appointment for follow-up visit.  You will likely need a colonoscopy. Please seek immediate medical care if you develop any of the following symptoms: You faint. You have very bad pain in your butt. At this time there does not appear to be the presence of an emergent medical condition, however there is always the potential for conditions to change. Please read and follow the below instructions.  Do not take your medicine if  develop an itchy rash, swelling in your mouth or lips, or difficulty breathing; call 911 and seek immediate emergency medical attention if this occurs.  You may review your lab tests and imaging results in their entirety on your MyChart account.  Please discuss all results of fully with your primary care provider and other specialist at your follow-up visit.  Note: Portions of this text may have been transcribed using voice recognition software. Every effort was made to ensure accuracy; however, inadvertent computerized transcription errors may still be present.

## 2023-04-29 NOTE — ED Triage Notes (Signed)
Patient is here evaluation of blood in stool. Pt reports bright red blood in toilet after having a bowel movement this morning. Pt reports a small amount of pain the lower left quadrant. No other complaints.
